# Patient Record
Sex: Female | Born: 1990 | Race: Asian | Hispanic: No | Marital: Single | Smoking: Never smoker
Health system: Southern US, Community
[De-identification: ages and names within clinical notes are randomized; demographics above are authoritative.]

## PROBLEM LIST (undated history)

## (undated) DIAGNOSIS — N760 Acute vaginitis: Secondary | ICD-10-CM

## (undated) DIAGNOSIS — B9689 Other specified bacterial agents as the cause of diseases classified elsewhere: Secondary | ICD-10-CM

## (undated) DIAGNOSIS — F419 Anxiety disorder, unspecified: Secondary | ICD-10-CM

---

## 2015-07-29 ENCOUNTER — Emergency Department (HOSPITAL_COMMUNITY)
Admission: EM | Admit: 2015-07-29 | Discharge: 2015-07-29 | Disposition: A | Discharge status: Home / Self Care | Payer: Self-pay | Attending: Emergency Medicine | Admitting: Emergency Medicine

## 2015-07-29 ENCOUNTER — Encounter (HOSPITAL_COMMUNITY): Payer: Self-pay | Admitting: Emergency Medicine

## 2015-07-29 ENCOUNTER — Emergency Department (HOSPITAL_COMMUNITY)
Admission: EM | Admit: 2015-07-29 | Discharge: 2015-07-30 | Disposition: A | Discharge status: Home / Self Care | Payer: Self-pay | Attending: Emergency Medicine | Admitting: Emergency Medicine

## 2015-07-29 DIAGNOSIS — R51 Headache: Secondary | ICD-10-CM | POA: Insufficient documentation

## 2015-07-29 DIAGNOSIS — R112 Nausea with vomiting, unspecified: Secondary | ICD-10-CM | POA: Insufficient documentation

## 2015-07-29 DIAGNOSIS — Z3202 Encounter for pregnancy test, result negative: Secondary | ICD-10-CM | POA: Insufficient documentation

## 2015-07-29 DIAGNOSIS — R109 Unspecified abdominal pain: Secondary | ICD-10-CM | POA: Insufficient documentation

## 2015-07-29 DIAGNOSIS — R251 Tremor, unspecified: Secondary | ICD-10-CM | POA: Insufficient documentation

## 2015-07-29 DIAGNOSIS — R1110 Vomiting, unspecified: Secondary | ICD-10-CM | POA: Insufficient documentation

## 2015-07-29 LAB — I-STAT BETA HCG BLOOD, ED (MC, WL, AP ONLY)

## 2015-07-29 MED ORDER — SODIUM CHLORIDE 0.9 % IV BOLUS (SEPSIS)
1000.0000 mL | Freq: Once | INTRAVENOUS | Status: AC
  Administered 2015-07-29: 1000 mL via INTRAVENOUS

## 2015-07-29 MED ORDER — KETOROLAC TROMETHAMINE 30 MG/ML IJ SOLN
30.0000 mg | Freq: Once | INTRAMUSCULAR | Status: AC
  Administered 2015-07-29: 30 mg via INTRAVENOUS
  Filled 2015-07-29: qty 1

## 2015-07-29 MED ORDER — SODIUM CHLORIDE 0.9 % IV BOLUS (SEPSIS)
1000.0000 mL | Freq: Once | INTRAVENOUS | Status: AC
  Administered 2015-07-30: 1000 mL via INTRAVENOUS

## 2015-07-29 MED ORDER — ONDANSETRON HCL 4 MG/2ML IJ SOLN
4.0000 mg | Freq: Once | INTRAMUSCULAR | Status: AC
  Administered 2015-07-29: 4 mg via INTRAVENOUS
  Filled 2015-07-29: qty 2

## 2015-07-29 NOTE — ED Provider Notes (Signed)
CSN: [id number]     Arrival date & time 07/29/15  2300 History  By signing my name below, I, Katherine West, attest that this documentation has been prepared under the direction and in the presence of Katherine Lyonsouglas Sherri Mcarthy, MD. Electronically Signed: Bethel BornBritney West, ED Scribe. 07/29/2015. 11:25 PM   Chief Complaint  Patient presents with  . Abdominal Pain   The history is provided by the patient. No language interpreter was used.   Brought in by EMS, Katherine LemmingsMarley Petrucelli is a 25 y.o. female who presents to the Emergency Department complaining of constant nausea and vomiting with onset this morning. Pt states that last night she drank a lot of beer and alcohol and has been vomiting since. Her emesis has the appearance of blood streaked bile. Pt denies abdominal pain. Pt last urinated just PTA. No known sick contact. She is otherwise healthy and has no history of abdominal surgery. She denies risk of pregnancy.    History reviewed. No pertinent past medical history. History reviewed. No pertinent past surgical history. History reviewed. No pertinent family history. Social History  Substance Use Topics  . Smoking status: Never Smoker   . Smokeless tobacco: Never Used  . Alcohol Use: Yes   OB History    No data available     Review of Systems  10 Systems reviewed and all are negative for acute change except as noted in the HPI.  Allergies  Review of patient's allergies indicates no known allergies.  Home Medications   Prior to Admission medications   Medication Sig Start Date End Date Taking? Authorizing Provider  diphenhydrAMINE (BENADRYL) 25 MG tablet Take 25 mg by mouth every 6 (six) hours as needed for sleep.   Yes Historical Provider, MD  ibuprofen (ADVIL,MOTRIN) 800 MG tablet Take 800 mg by mouth every 8 (eight) hours as needed for moderate pain.   Yes Historical Provider, MD   BP 128/77 mmHg  Pulse 99  Temp(Src) 98 F (36.7 C) (Oral)  Resp 20  Ht 5\' 11"  (1.803 m)  Wt 145 lb  (65.772 kg)  BMI 20.23 kg/m2  SpO2 100% Physical Exam  Constitutional: She is oriented to person, place, and time. She appears well-developed and well-nourished. No distress.  HENT:  Head: Normocephalic and atraumatic.  Mouth/Throat: Mucous membranes are dry.  Eyes: EOM are normal.  Neck: Normal range of motion.  Cardiovascular: Normal rate, regular rhythm and normal heart sounds.   Pulmonary/Chest: Effort normal and breath sounds normal.  Abdominal: Soft. She exhibits no distension. There is no tenderness.  Musculoskeletal: Normal range of motion.  Neurological: She is alert and oriented to person, place, and time.  Skin: Skin is warm and dry.  Psychiatric: She has a normal mood and affect. Judgment normal.  Nursing note and vitals reviewed.   ED Course  Procedures (including critical care time) DIAGNOSTIC STUDIES: Oxygen Saturation is 100% on RA,  normal by my interpretation.    COORDINATION OF CARE: 11:24 PM Discussed treatment plan which includes lab work, antiemetic, and IVF with pt at bedside and pt agreed to plan.  Labs Review Labs Reviewed - No data to display  Imaging Review No results found. I have personally reviewed and evaluated these lab results as part of my medical decision-making.    MDM   Final diagnoses:  None    Patient presents with abdominal pain and vomiting. She reports consuming a significant quantity of alcohol the night before and her vomiting started after she woke up. She reports seeing  trace amounts of blood in her vomit. Her physical examination is benign. She was hydrated with normal saline and given Zofran and Phenergan. She is now feeling better and requesting to go home. She will be discharged with Zofran and when necessary return.  I personally performed the services described in this documentation, which was scribed in my presence. The recorded information has been reviewed and is accurate.       Katherine Lyons, MD 07/30/15 [phone]

## 2015-07-29 NOTE — ED Notes (Signed)
Brought in by EMS from home with c/o abdominal pain.  Per EMS, pt reported that she has been having diffused abdominal pain with nausea and vomiting.  Pt is very concerned on "blood in her emesis"; pt reported that she has "streaks of blood" in her vomitus.  Pt was given Zofran 4 mg and Versed 5 mg IV en route to ED.

## 2015-07-29 NOTE — ED Notes (Signed)
Bed: WA09 Expected date:  Expected time:  Means of arrival:  Comments: ems 

## 2015-07-29 NOTE — ED Notes (Signed)
Pt called twice, no answer 

## 2015-07-30 LAB — CBC WITH DIFFERENTIAL/PLATELET
BASOS ABS: 0 10*3/uL (ref 0.0–0.1)
Basophils Relative: 0 %
EOS PCT: 1 %
Eosinophils Absolute: 0.1 10*3/uL (ref 0.0–0.7)
HCT: 32.8 % — ABNORMAL LOW (ref 36.0–46.0)
Hemoglobin: 11.1 g/dL — ABNORMAL LOW (ref 12.0–15.0)
LYMPHS ABS: 1.5 10*3/uL (ref 0.7–4.0)
Lymphocytes Relative: 17 %
MCH: 29.7 pg (ref 26.0–34.0)
MCHC: 33.8 g/dL (ref 30.0–36.0)
MCV: 87.7 fL (ref 78.0–100.0)
MONO ABS: 0.6 10*3/uL (ref 0.1–1.0)
MONOS PCT: 7 %
Neutro Abs: 6.4 10*3/uL (ref 1.7–7.7)
Neutrophils Relative %: 75 %
PLATELETS: UNDETERMINED 10*3/uL (ref 150–400)
RBC: 3.74 MIL/uL — AB (ref 3.87–5.11)
RDW: 13.4 % (ref 11.5–15.5)
WBC: 8.6 10*3/uL (ref 4.0–10.5)

## 2015-07-30 LAB — COMPREHENSIVE METABOLIC PANEL
ALT: 19 U/L (ref 14–54)
ANION GAP: 12 (ref 5–15)
AST: 26 U/L (ref 15–41)
Albumin: 4 g/dL (ref 3.5–5.0)
Alkaline Phosphatase: 44 U/L (ref 38–126)
BUN: 12 mg/dL (ref 6–20)
CALCIUM: 8.1 mg/dL — AB (ref 8.9–10.3)
CHLORIDE: 112 mmol/L — AB (ref 101–111)
CO2: 20 mmol/L — AB (ref 22–32)
Creatinine, Ser: 0.73 mg/dL (ref 0.44–1.00)
Glucose, Bld: 85 mg/dL (ref 65–99)
Potassium: 3.8 mmol/L (ref 3.5–5.1)
SODIUM: 144 mmol/L (ref 135–145)
Total Bilirubin: 0.5 mg/dL (ref 0.3–1.2)
Total Protein: 6.6 g/dL (ref 6.5–8.1)

## 2015-07-30 LAB — LIPASE, BLOOD: LIPASE: 27 U/L (ref 11–51)

## 2015-07-30 MED ORDER — PROMETHAZINE HCL 25 MG/ML IJ SOLN
12.5000 mg | Freq: Once | INTRAMUSCULAR | Status: AC
Start: 2015-07-30 — End: 2015-07-30
  Administered 2015-07-30: 12.5 mg via INTRAVENOUS
  Filled 2015-07-30: qty 1

## 2015-07-30 MED ORDER — ONDANSETRON 8 MG PO TBDP: ORAL_TABLET | ORAL | Status: DC

## 2015-07-30 NOTE — ED Notes (Signed)
No answer when pt's name called in the waiting room 

## 2015-07-30 NOTE — ED Notes (Signed)
No answer when pt's name called in the waiting room; unable to locate pt' pt eloped from waiting room prior to triage

## 2015-07-30 NOTE — Discharge Instructions (Signed)
Zofran as prescribed as needed for nausea. ° °Return to the emergency department if symptoms significantly worsen or change. ° ° °Nausea and Vomiting °Nausea is a sick feeling that often comes before throwing up (vomiting). Vomiting is a reflex where stomach contents come out of your mouth. Vomiting can cause severe loss of body fluids (dehydration). Children and elderly adults can become dehydrated quickly, especially if they also have diarrhea. Nausea and vomiting are symptoms of a condition or disease. It is important to find the cause of your symptoms. °CAUSES  °· Direct irritation of the stomach lining. This irritation can result from increased acid production (gastroesophageal reflux disease), infection, food poisoning, taking certain medicines (such as nonsteroidal anti-inflammatory drugs), alcohol use, or tobacco use. °· Signals from the brain. These signals could be caused by a headache, heat exposure, an inner ear disturbance, increased pressure in the brain from injury, infection, a tumor, or a concussion, pain, emotional stimulus, or metabolic problems. °· An obstruction in the gastrointestinal tract (bowel obstruction). °· Illnesses such as diabetes, hepatitis, gallbladder problems, appendicitis, kidney problems, cancer, sepsis, atypical symptoms of a heart attack, or eating disorders. °· Medical treatments such as chemotherapy and radiation. °· Receiving medicine that makes you sleep (general anesthetic) during surgery. °DIAGNOSIS °Your caregiver may ask for tests to be done if the problems do not improve after a few days. Tests may also be done if symptoms are severe or if the reason for the nausea and vomiting is not clear. Tests may include: °· Urine tests. °· Blood tests. °· Stool tests. °· Cultures (to look for evidence of infection). °· X-rays or other imaging studies. °Test results can help your caregiver make decisions about treatment or the need for additional tests. °TREATMENT °You need to  stay well hydrated. Drink frequently but in small amounts. You may wish to drink water, sports drinks, clear broth, or eat frozen ice pops or gelatin dessert to help stay hydrated. When you eat, eating slowly may help prevent nausea. There are also some antinausea medicines that may help prevent nausea. °HOME CARE INSTRUCTIONS  °· Take all medicine as directed by your caregiver. °· If you do not have an appetite, do not force yourself to eat. However, you must continue to drink fluids. °· If you have an appetite, eat a normal diet unless your caregiver tells you differently. °¨ Eat a variety of complex carbohydrates (rice, wheat, potatoes, bread), lean meats, yogurt, fruits, and vegetables. °¨ Avoid high-fat foods because they are more difficult to digest. °· Drink enough water and fluids to keep your urine clear or pale yellow. °· If you are dehydrated, ask your caregiver for specific rehydration instructions. Signs of dehydration may include: °¨ Severe thirst. °¨ Dry lips and mouth. °¨ Dizziness. °¨ Dark urine. °¨ Decreasing urine frequency and amount. °¨ Confusion. °¨ Rapid breathing or pulse. °SEEK IMMEDIATE MEDICAL CARE IF:  °· You have blood or brown flecks (like coffee grounds) in your vomit. °· You have black or bloody stools. °· You have a severe headache or stiff neck. °· You are confused. °· You have severe abdominal pain. °· You have chest pain or trouble breathing. °· You do not urinate at least once every 8 hours. °· You develop cold or clammy skin. °· You continue to vomit for longer than 24 to 48 hours. °· You have a fever. °MAKE SURE YOU:  °· Understand these instructions. °· Will watch your condition. °· Will get help right away if you are not doing well or get   worse. °  °This information is not intended to replace advice given to you by your health care provider. Make sure you discuss any questions you have with your health care provider. °  °Document Released: 04/29/2005 Document Revised:  07/22/2011 Document Reviewed: 09/26/2010 °Elsevier Interactive Patient Education ©2016 [employer] ° °

## 2015-11-10 ENCOUNTER — Encounter (HOSPITAL_COMMUNITY): Payer: Self-pay

## 2015-11-10 ENCOUNTER — Encounter (HOSPITAL_COMMUNITY): Payer: Self-pay | Admitting: *Deleted

## 2015-11-10 ENCOUNTER — Emergency Department (HOSPITAL_COMMUNITY)
Admission: EM | Admit: 2015-11-10 | Discharge: 2015-11-10 | Disposition: A | Discharge status: Other Acute Inpatient Hospital | Payer: Self-pay | Attending: Emergency Medicine | Admitting: Emergency Medicine

## 2015-11-10 ENCOUNTER — Observation Stay (HOSPITAL_COMMUNITY)
Admission: AD | Admit: 2015-11-10 | Discharge: 2015-11-11 | Disposition: A | Discharge status: Home / Self Care | Payer: Self-pay | Source: Intra-hospital | Attending: Psychiatry | Admitting: Psychiatry

## 2015-11-10 DIAGNOSIS — Z791 Long term (current) use of non-steroidal anti-inflammatories (NSAID): Secondary | ICD-10-CM | POA: Insufficient documentation

## 2015-11-10 DIAGNOSIS — F329 Major depressive disorder, single episode, unspecified: Secondary | ICD-10-CM | POA: Insufficient documentation

## 2015-11-10 DIAGNOSIS — F1320 Sedative, hypnotic or anxiolytic dependence, uncomplicated: Secondary | ICD-10-CM | POA: Diagnosis present

## 2015-11-10 DIAGNOSIS — F10239 Alcohol dependence with withdrawal, unspecified: Secondary | ICD-10-CM | POA: Insufficient documentation

## 2015-11-10 DIAGNOSIS — Z79899 Other long term (current) drug therapy: Secondary | ICD-10-CM | POA: Insufficient documentation

## 2015-11-10 DIAGNOSIS — F10920 Alcohol use, unspecified with intoxication, uncomplicated: Secondary | ICD-10-CM

## 2015-11-10 DIAGNOSIS — F411 Generalized anxiety disorder: Secondary | ICD-10-CM | POA: Insufficient documentation

## 2015-11-10 DIAGNOSIS — F4010 Social phobia, unspecified: Secondary | ICD-10-CM | POA: Insufficient documentation

## 2015-11-10 DIAGNOSIS — F1994 Other psychoactive substance use, unspecified with psychoactive substance-induced mood disorder: Secondary | ICD-10-CM

## 2015-11-10 DIAGNOSIS — F1024 Alcohol dependence with alcohol-induced mood disorder: Principal | ICD-10-CM | POA: Insufficient documentation

## 2015-11-10 DIAGNOSIS — F1020 Alcohol dependence, uncomplicated: Secondary | ICD-10-CM | POA: Diagnosis present

## 2015-11-10 DIAGNOSIS — R000 Tachycardia, unspecified: Secondary | ICD-10-CM | POA: Insufficient documentation

## 2015-11-10 DIAGNOSIS — F10229 Alcohol dependence with intoxication, unspecified: Secondary | ICD-10-CM | POA: Insufficient documentation

## 2015-11-10 DIAGNOSIS — R45851 Suicidal ideations: Secondary | ICD-10-CM

## 2015-11-10 DIAGNOSIS — F1010 Alcohol abuse, uncomplicated: Secondary | ICD-10-CM | POA: Diagnosis present

## 2015-11-10 DIAGNOSIS — F10120 Alcohol abuse with intoxication, uncomplicated: Secondary | ICD-10-CM | POA: Insufficient documentation

## 2015-11-10 LAB — RAPID URINE DRUG SCREEN, HOSP PERFORMED
AMPHETAMINES: NOT DETECTED
BENZODIAZEPINES: POSITIVE — AB
Barbiturates: NOT DETECTED
COCAINE: NOT DETECTED
OPIATES: NOT DETECTED
Tetrahydrocannabinol: NOT DETECTED

## 2015-11-10 LAB — COMPREHENSIVE METABOLIC PANEL
ALT: 26 U/L (ref 14–54)
ANION GAP: 7 (ref 5–15)
AST: 30 U/L (ref 15–41)
Albumin: 5 g/dL (ref 3.5–5.0)
Alkaline Phosphatase: 55 U/L (ref 38–126)
BILIRUBIN TOTAL: 0.3 mg/dL (ref 0.3–1.2)
BUN: 12 mg/dL (ref 6–20)
CHLORIDE: 109 mmol/L (ref 101–111)
CO2: 26 mmol/L (ref 22–32)
Calcium: 9.1 mg/dL (ref 8.9–10.3)
Creatinine, Ser: 0.85 mg/dL (ref 0.44–1.00)
Glucose, Bld: 96 mg/dL (ref 65–99)
POTASSIUM: 3.9 mmol/L (ref 3.5–5.1)
Sodium: 142 mmol/L (ref 135–145)
TOTAL PROTEIN: 8 g/dL (ref 6.5–8.1)

## 2015-11-10 LAB — I-STAT BETA HCG BLOOD, ED (MC, WL, AP ONLY)

## 2015-11-10 LAB — CBC WITH DIFFERENTIAL/PLATELET
BASOS ABS: 0 10*3/uL (ref 0.0–0.1)
Basophils Relative: 0 %
EOS ABS: 0.2 10*3/uL (ref 0.0–0.7)
EOS PCT: 3 %
HCT: 40 % (ref 36.0–46.0)
Hemoglobin: 14 g/dL (ref 12.0–15.0)
LYMPHS ABS: 3.2 10*3/uL (ref 0.7–4.0)
Lymphocytes Relative: 43 %
MCH: 30.8 pg (ref 26.0–34.0)
MCHC: 35 g/dL (ref 30.0–36.0)
MCV: 87.9 fL (ref 78.0–100.0)
MONO ABS: 0.4 10*3/uL (ref 0.1–1.0)
Monocytes Relative: 6 %
Neutro Abs: 3.6 10*3/uL (ref 1.7–7.7)
Neutrophils Relative %: 48 %
PLATELETS: 296 10*3/uL (ref 150–400)
RBC: 4.55 MIL/uL (ref 3.87–5.11)
RDW: 12.9 % (ref 11.5–15.5)
WBC: 7.3 10*3/uL (ref 4.0–10.5)

## 2015-11-10 LAB — I-STAT CHEM 8, ED
BUN: 11 mg/dL (ref 6–20)
CALCIUM ION: 1.12 mmol/L — AB (ref 1.13–1.30)
CREATININE: 1.4 mg/dL — AB (ref 0.44–1.00)
Chloride: 106 mmol/L (ref 101–111)
GLUCOSE: 90 mg/dL (ref 65–99)
HEMATOCRIT: 42 % (ref 36.0–46.0)
HEMOGLOBIN: 14.3 g/dL (ref 12.0–15.0)
Potassium: 3.8 mmol/L (ref 3.5–5.1)
Sodium: 144 mmol/L (ref 135–145)
TCO2: 25 mmol/L (ref 0–100)

## 2015-11-10 LAB — ETHANOL: ALCOHOL ETHYL (B): 335 mg/dL — AB (ref <5)

## 2015-11-10 LAB — SALICYLATE LEVEL: Salicylate Lvl: <4 mg/dL (ref 2.8–30.0)

## 2015-11-10 LAB — ACETAMINOPHEN LEVEL

## 2015-11-10 MED ORDER — HYDROXYZINE HCL 25 MG PO TABS
25.0000 mg | ORAL_TABLET | Freq: Four times a day (QID) | ORAL | Status: DC | PRN
  Administered 2015-11-10: 25 mg via ORAL
  Filled 2015-11-10 (×2): qty 1

## 2015-11-10 MED ORDER — MAGNESIUM HYDROXIDE 400 MG/5ML PO SUSP: 30.0000 mL | Freq: Every day | ORAL | Status: DC | PRN

## 2015-11-10 MED ORDER — CHLORDIAZEPOXIDE HCL 25 MG PO CAPS: 25.0000 mg | ORAL_CAPSULE | Freq: Every day | ORAL | Status: DC

## 2015-11-10 MED ORDER — CHLORDIAZEPOXIDE HCL 25 MG PO CAPS: 25.0000 mg | ORAL_CAPSULE | ORAL | Status: DC

## 2015-11-10 MED ORDER — ACETAMINOPHEN 325 MG PO TABS: 650.0000 mg | ORAL_TABLET | ORAL | Status: DC | PRN

## 2015-11-10 MED ORDER — VITAMIN B-1 100 MG PO TABS: 100.0000 mg | ORAL_TABLET | Freq: Every day | ORAL | Status: DC

## 2015-11-10 MED ORDER — ONDANSETRON 4 MG PO TBDP: 4.0000 mg | ORAL_TABLET | Freq: Four times a day (QID) | ORAL | Status: DC | PRN

## 2015-11-10 MED ORDER — ADULT MULTIVITAMIN W/MINERALS CH
1.0000 | ORAL_TABLET | Freq: Every day | ORAL | Status: DC
  Administered 2015-11-10: 1 via ORAL
  Filled 2015-11-10: qty 1

## 2015-11-10 MED ORDER — IBUPROFEN 600 MG PO TABS: 600.0000 mg | ORAL_TABLET | Freq: Three times a day (TID) | ORAL | Status: DC | PRN

## 2015-11-10 MED ORDER — CHLORDIAZEPOXIDE HCL 25 MG PO CAPS
25.0000 mg | ORAL_CAPSULE | Freq: Three times a day (TID) | ORAL | Status: DC
  Filled 2015-11-10: qty 1

## 2015-11-10 MED ORDER — LOPERAMIDE HCL 2 MG PO CAPS: 2.0000 mg | ORAL_CAPSULE | ORAL | Status: DC | PRN

## 2015-11-10 MED ORDER — GABAPENTIN 100 MG PO CAPS
200.0000 mg | ORAL_CAPSULE | Freq: Two times a day (BID) | ORAL | Status: DC
  Administered 2015-11-10: 200 mg via ORAL
  Filled 2015-11-10: qty 2

## 2015-11-10 MED ORDER — CHLORDIAZEPOXIDE HCL 25 MG PO CAPS: 25.0000 mg | ORAL_CAPSULE | Freq: Four times a day (QID) | ORAL | Status: DC | PRN

## 2015-11-10 MED ORDER — ONDANSETRON HCL 4 MG/2ML IJ SOLN
4.0000 mg | Freq: Once | INTRAMUSCULAR | Status: AC
  Administered 2015-11-10: 4 mg via INTRAVENOUS
  Filled 2015-11-10: qty 2

## 2015-11-10 MED ORDER — IBUPROFEN 200 MG PO TABS
600.0000 mg | ORAL_TABLET | Freq: Three times a day (TID) | ORAL | Status: DC | PRN
  Filled 2015-11-10: qty 3

## 2015-11-10 MED ORDER — HYDROXYZINE HCL 25 MG PO TABS
25.0000 mg | ORAL_TABLET | Freq: Four times a day (QID) | ORAL | Status: DC | PRN
Start: 2015-11-10 — End: 2015-11-10

## 2015-11-10 MED ORDER — ONDANSETRON HCL 4 MG PO TABS: 4.0000 mg | ORAL_TABLET | Freq: Three times a day (TID) | ORAL | Status: DC | PRN

## 2015-11-10 MED ORDER — SODIUM CHLORIDE 0.9 % IV BOLUS (SEPSIS)
1000.0000 mL | Freq: Once | INTRAVENOUS | Status: AC
  Administered 2015-11-10: 1000 mL via INTRAVENOUS

## 2015-11-10 MED ORDER — GABAPENTIN 100 MG PO CAPS
200.0000 mg | ORAL_CAPSULE | Freq: Two times a day (BID) | ORAL | Status: DC
  Administered 2015-11-10 – 2015-11-11 (×2): 200 mg via ORAL
  Filled 2015-11-10: qty 28
  Filled 2015-11-10 (×2): qty 2

## 2015-11-10 MED ORDER — ALUM & MAG HYDROXIDE-SIMETH 200-200-20 MG/5ML PO SUSP: 30.0000 mL | ORAL | Status: DC | PRN

## 2015-11-10 MED ORDER — VITAMIN B-1 100 MG PO TABS
100.0000 mg | ORAL_TABLET | Freq: Every day | ORAL | Status: DC
  Administered 2015-11-11: 100 mg via ORAL
  Filled 2015-11-10: qty 1

## 2015-11-10 MED ORDER — CHLORDIAZEPOXIDE HCL 25 MG PO CAPS
25.0000 mg | ORAL_CAPSULE | Freq: Four times a day (QID) | ORAL | Status: AC
  Administered 2015-11-10 – 2015-11-11 (×3): 25 mg via ORAL
  Filled 2015-11-10 (×3): qty 1

## 2015-11-10 MED ORDER — CHLORDIAZEPOXIDE HCL 25 MG PO CAPS: 25.0000 mg | ORAL_CAPSULE | Freq: Three times a day (TID) | ORAL | Status: DC

## 2015-11-10 MED ORDER — CHLORDIAZEPOXIDE HCL 25 MG PO CAPS
25.0000 mg | ORAL_CAPSULE | Freq: Four times a day (QID) | ORAL | Status: DC
  Filled 2015-11-10: qty 1

## 2015-11-10 MED ORDER — CHLORDIAZEPOXIDE HCL 25 MG PO CAPS
25.0000 mg | ORAL_CAPSULE | Freq: Four times a day (QID) | ORAL | Status: DC | PRN
  Administered 2015-11-11: 25 mg via ORAL

## 2015-11-10 MED ORDER — THIAMINE HCL 100 MG/ML IJ SOLN
100.0000 mg | Freq: Once | INTRAMUSCULAR | Status: AC
  Administered 2015-11-10: 100 mg via INTRAMUSCULAR
  Filled 2015-11-10: qty 2

## 2015-11-10 MED ORDER — ADULT MULTIVITAMIN W/MINERALS CH
1.0000 | ORAL_TABLET | Freq: Every day | ORAL | Status: DC
  Administered 2015-11-11: 1 via ORAL
  Filled 2015-11-10: qty 1

## 2015-11-10 MED ORDER — ONDANSETRON 4 MG PO TBDP
4.0000 mg | ORAL_TABLET | Freq: Four times a day (QID) | ORAL | Status: DC | PRN
  Administered 2015-11-10: 4 mg via ORAL
  Filled 2015-11-10: qty 1

## 2015-11-10 NOTE — ED Notes (Signed)
ETOH 335 per lab, notified RN.  MD notified.

## 2015-11-10 NOTE — ED Provider Notes (Signed)
CSN: [id number]     Arrival date & time 11/10/15  0245 History  By signing my name below, I, Rosario AdieWilliam Andrew Hiatt, attest that this documentation has been prepared under the direction and in the presence of Armina Galloway, MD.  Electronically Signed: Rosario AdieWilliam Andrew Hiatt, ED Scribe. 11/10/2015. 3:13 AM.   Chief Complaint  Patient presents with  . Suicidal  . Alcohol Intoxication   Patient is a 25 y.o. female presenting with mental health disorder. The history is provided by the patient and the EMS personnel. No language interpreter was used.  Mental Health Problem Presenting symptoms: suicidal thoughts   Presenting symptoms: no disorganized speech   Onset quality:  Unable to specify Timing:  Constant Progression:  Unchanged Chronicity:  New Context: alcohol use and medication   Treatment compliance:  Untreated Relieved by:  Nothing Worsened by:  Nothing tried Ineffective treatments:  None tried Associated symptoms: no abdominal pain     HPI Comments: Katherine West is a 25 y.o. female BIB EMS with a PMHx significant for anxiety who presents to the Emergency Department with alcohol intoxication and SI onset PTA. Pt reports that she drank an entire bottle of Absolut vodka and "some wine" before arrival, but is unable to state the size of the bottles or over how many hours she drank both of them. She also notes that she took 4 2mg  Xanax pills because of her anxiety. She also reports that she was having SI, but did not state any plan. She is not complaining of any other symptoms at this time.  No past medical history on file. History reviewed. No pertinent past surgical history. No family history on file. Social History  Substance Use Topics  . Smoking status: Never Smoker   . Smokeless tobacco: Never Used  . Alcohol Use: Yes   OB History    No data available     Review of Systems  Gastrointestinal: Negative for abdominal pain.  Psychiatric/Behavioral: Positive for suicidal  ideas.  All other systems reviewed and are negative.  Allergies  Review of patient's allergies indicates no known allergies.  Home Medications   Prior to Admission medications   Medication Sig Start Date End Date Taking? Authorizing Provider  diphenhydrAMINE (BENADRYL) 25 MG tablet Take 25 mg by mouth every 6 (six) hours as needed for sleep.    Historical Provider, MD  ibuprofen (ADVIL,MOTRIN) 800 MG tablet Take 800 mg by mouth every 8 (eight) hours as needed for moderate pain.    Historical Provider, MD  ondansetron (ZOFRAN ODT) 8 MG disintegrating tablet 8mg  ODT q4 hours prn nausea 07/30/15   Geoffery Lyonsouglas Delo, MD   BP 133/86 mmHg  Pulse 98  Temp(Src) 97.9 F (36.6 C) (Oral)  Resp 20  SpO2 98%   Physical Exam  Constitutional: She appears well-developed and well-nourished. No distress.  HENT:  Head: Normocephalic and atraumatic.  Mouth/Throat: Oropharynx is clear and moist. No oropharyngeal exudate.  She has a Mallampati class 1 airway.   Eyes: Conjunctivae and EOM are normal. Pupils are equal, round, and reactive to light.  Neck: Trachea normal and normal range of motion. Neck supple. No JVD present. Carotid bruit is not present.  Cardiovascular: Regular rhythm and normal heart sounds.  Tachycardia present.  Exam reveals no gallop and no friction rub.   No murmur heard. Pulses:      Radial pulses are 2+ on the right side, and 2+ on the left side.  Pulmonary/Chest: Effort normal and breath sounds normal. No stridor.  No respiratory distress. She has no wheezes. She has no rales. She exhibits no tenderness.  Abdominal: Soft. Bowel sounds are normal. There is no tenderness. There is no rebound and no guarding.  Abdomen is protuberant.   Musculoskeletal: Normal range of motion.  Lymphadenopathy:    She has no cervical adenopathy.  Neurological: She is alert. She has normal reflexes. No cranial nerve deficit. She exhibits normal muscle tone. Coordination normal.  She has intact  phonation.  Skin: Skin is warm and dry. She is not diaphoretic.  Psychiatric: Her mood appears anxious. Her speech is tangential. She expresses suicidal ideation.  Pt is tearful on exam.  Nursing note and vitals reviewed.  ED Course  Procedures (including critical care time) DIAGNOSTIC STUDIES: Oxygen Saturation is 99% on RA, normal by my interpretation.   COORDINATION OF CARE: 2:47 AM-Discussed next steps with pt. Pt verbalized understanding and is agreeable with the plan.   Labs Review Labs Reviewed  CBC WITH DIFFERENTIAL/PLATELET  COMPREHENSIVE METABOLIC PANEL  ETHANOL  URINE RAPID DRUG SCREEN, HOSP PERFORMED  ACETAMINOPHEN LEVEL  SALICYLATE LEVEL  I-STAT BETA HCG BLOOD, ED ([facility], [facility], [facility] ONLY)  I-STAT CHEM 8, ED    Imaging Review No results found.  I have personally reviewed and evaluated these images and lab results as part of my medical decision-making.   EKG Interpretation None      MDM   Final diagnoses:  None     EKG Interpretation  Date/Time:  Friday November 10 2015 02:47:59 EDT Ventricular Rate:  101 PR Interval:    QRS Duration: 88 QT Interval:  324 QTC Calculation: 420 R Axis:   82 Text Interpretation:  Sinus tachycardia Confirmed by [facility]  MD, Pradeep Beaubrun ([id number]) on 11/10/2015 5:12:52 AM      Filed Vitals:   11/10/15 0358 11/10/15 0551  BP: 118/75 123/88  Pulse: 94 87  Temp:    Resp: 19 20   Results for orders placed or performed during the hospital encounter of 11/10/15  CBC with Differential/Platelet  Result Value Ref Range   WBC 7.3 4.0 - 10.5 K/uL   RBC 4.55 3.87 - 5.11 MIL/uL   Hemoglobin 14.0 12.0 - 15.0 g/dL   HCT 60.4 54.0 - 98.1 %   MCV 87.9 78.0 - 100.0 fL   MCH 30.8 26.0 - 34.0 pg   MCHC 35.0 30.0 - 36.0 g/dL   RDW 19.1 47.8 - 29.5 %   Platelets 296 150 - 400 K/uL   Neutrophils Relative % 48 %   Neutro Abs 3.6 1.7 - 7.7 K/uL   Lymphocytes Relative 43 %   Lymphs Abs 3.2 0.7 - 4.0 K/uL   Monocytes Relative 6 %    Monocytes Absolute 0.4 0.1 - 1.0 K/uL   Eosinophils Relative 3 %   Eosinophils Absolute 0.2 0.0 - 0.7 K/uL   Basophils Relative 0 %   Basophils Absolute 0.0 0.0 - 0.1 K/uL  Comprehensive metabolic panel  Result Value Ref Range   Sodium 142 135 - 145 mmol/L   Potassium 3.9 3.5 - 5.1 mmol/L   Chloride 109 101 - 111 mmol/L   CO2 26 22 - 32 mmol/L   Glucose, Bld 96 65 - 99 mg/dL   BUN 12 6 - 20 mg/dL   Creatinine, Ser 6.21 0.44 - 1.00 mg/dL   Calcium 9.1 8.9 - 30.8 mg/dL   Total Protein 8.0 6.5 - 8.1 g/dL   Albumin 5.0 3.5 - 5.0 g/dL   AST 30 15 - 41  U/L   ALT 26 14 - 54 U/L   Alkaline Phosphatase 55 38 - 126 U/L   Total Bilirubin 0.3 0.3 - 1.2 mg/dL   GFR calc non Af Amer >60 >60 mL/min   GFR calc Af Amer >60 >60 mL/min   Anion gap 7 5 - 15  Ethanol  Result Value Ref Range   Alcohol, Ethyl (B) 335 (HH) <5 mg/dL  Urine rapid drug screen (hosp performed)  Result Value Ref Range   Opiates NONE DETECTED NONE DETECTED   Cocaine NONE DETECTED NONE DETECTED   Benzodiazepines POSITIVE (A) NONE DETECTED   Amphetamines NONE DETECTED NONE DETECTED   Tetrahydrocannabinol NONE DETECTED NONE DETECTED   Barbiturates NONE DETECTED NONE DETECTED  Acetaminophen level  Result Value Ref Range   Acetaminophen (Tylenol), Serum <10 (L) 10 - 30 ug/mL  Salicylate level  Result Value Ref Range   Salicylate Lvl <4.0 2.8 - 30.0 mg/dL  I-Stat Beta hCG blood, ED ([facility], [facility], [facility] only)  Result Value Ref Range   I-stat hCG, quantitative <5.0 <5 mIU/mL   Comment 3          I-Stat Chem 8, ED  Result Value Ref Range   Sodium 144 135 - 145 mmol/L   Potassium 3.8 3.5 - 5.1 mmol/L   Chloride 106 101 - 111 mmol/L   BUN 11 6 - 20 mg/dL   Creatinine, Ser 2.951.40 (H) 0.44 - 1.00 mg/dL   Glucose, Bld 90 65 - 99 mg/dL   Calcium, Ion 2.841.12 (L) 1.13 - 1.30 mmol/L   TCO2 25 0 - 100 mmol/L   Hemoglobin 14.3 12.0 - 15.0 g/dL   HCT 13.242.0 44.036.0 - 10.246.0 %   No results found.  Medications  sodium chloride 0.9 % bolus 1,000  mL (0 mLs Intravenous Stopped 11/10/15 0623)  ondansetron (ZOFRAN) injection 4 mg (4 mg Intravenous Given 11/10/15 0622)    Will need to be cleared by psychiatry as she repeated that she was suicidal in front of multiple staff members on more than one occasion  Psych hold orders placed I personally performed the services described in this documentation, which was scribed in my presence. The recorded information has been reviewed and is accurate.       Cy BlamerApril Shanie Mauzy, MD 11/10/15 [phone]
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
## 2015-11-10 NOTE — ED Notes (Signed)
Pt was giving a Malaw[place]key sandwich and water

## 2015-11-10 NOTE — Progress Notes (Signed)
Pt [place] ref[place] in and around [place]
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
## 2015-11-10 NOTE — ED Notes (Signed)
Patient has slept since arriving on the unit.  She was just [facility].  [facility]  Patient left the unit ambulatory with [employer].  All belongings given to the driver.
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
## 2015-11-10 NOTE — ED Notes (Addendum)
Per EMS- Pt found laying on floor, no fall. Had ETOH tonight. Denies drugs. Pt stated that she cannot breathe and that she felt as if she was going to have a seizure. Pt endorsing SI upon asking in room. Stated to have taken 8mg  of Xanax, not to hurt herself."

## 2015-11-11 LAB — PREGNANCY, URINE: PREG TEST UR: NEGATIVE

## 2015-11-11 MED ORDER — GABAPENTIN 100 MG PO CAPS: 200.0000 mg | ORAL_CAPSULE | Freq: Two times a day (BID) | ORAL | Status: AC

## 2015-11-11 MED ORDER — HYDROXYZINE HCL 25 MG PO TABS: 25.0000 mg | ORAL_TABLET | Freq: Four times a day (QID) | ORAL | Status: AC | PRN

## 2015-11-11 MED ORDER — ADULT MULTIVITAMIN W/MINERALS CH: 1.0000 | ORAL_TABLET | Freq: Every day | ORAL | Status: AC

## 2015-11-11 NOTE — Progress Notes (Signed)
D: Patient slept most of the shift. Staff woke patient up for bed time meds and assessment. Denies pain, SI, AH/VHat this time/ reports depression of 5/10. Minimal conversation. Made no new complaint. Patient sleeping at this time.  A: Staff offered support and encouragement as needed. Offered due meds as ordered. Constant monitoring for safety and stability. R: Patient is safe.
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
## 2015-11-11 NOTE — Progress Notes (Signed)
D: Pt d/c home as per MD's order. Pt was given outpatient resources in [place] for follow up care. Pt was picked up by her friend. A: Scheduled medications administered as prescribed. D/C instructions reviewed with pt. All belongings from locker 16 returned to pt at time of d/c. Emotional support and availability provided provided to pt. Safety maintained in Obs. Unit under constant visual supervision without self injurious behavior or outburst.  R: Pt A & O X 4. Pt compliant with medications when offered. Denies adverse drug reactions when assessed. Pt signed belonging sheet in agreement with items received from locker. No physical distress noted at time of d/c.

## 2015-11-16 ENCOUNTER — Encounter (HOSPITAL_COMMUNITY): Payer: Self-pay

## 2015-11-16 ENCOUNTER — Observation Stay (HOSPITAL_COMMUNITY)
Admission: EM | Admit: 2015-11-16 | Discharge: 2015-11-17 | Disposition: A | Discharge status: Other Acute Inpatient Hospital | Payer: Self-pay | Attending: Emergency Medicine | Admitting: Emergency Medicine

## 2015-11-16 ENCOUNTER — Emergency Department (HOSPITAL_COMMUNITY): Payer: Self-pay

## 2015-11-16 DIAGNOSIS — R45851 Suicidal ideations: Secondary | ICD-10-CM | POA: Insufficient documentation

## 2015-11-16 DIAGNOSIS — F1020 Alcohol dependence, uncomplicated: Secondary | ICD-10-CM

## 2015-11-16 DIAGNOSIS — S0083XA Contusion of other part of head, initial encounter: Secondary | ICD-10-CM | POA: Insufficient documentation

## 2015-11-16 DIAGNOSIS — W19XXXA Unspecified fall, initial encounter: Secondary | ICD-10-CM

## 2015-11-16 DIAGNOSIS — F1094 Alcohol use, unspecified with alcohol-induced mood disorder: Secondary | ICD-10-CM

## 2015-11-16 DIAGNOSIS — W228XXA Striking against or struck by other objects, initial encounter: Secondary | ICD-10-CM | POA: Insufficient documentation

## 2015-11-16 DIAGNOSIS — S301XXA Contusion of abdominal wall, initial encounter: Secondary | ICD-10-CM | POA: Insufficient documentation

## 2015-11-16 DIAGNOSIS — F10129 Alcohol abuse with intoxication, unspecified: Principal | ICD-10-CM | POA: Insufficient documentation

## 2015-11-16 DIAGNOSIS — I2699 Other pulmonary embolism without acute cor pulmonale: Secondary | ICD-10-CM | POA: Diagnosis present

## 2015-11-16 DIAGNOSIS — IMO0002 Reserved for concepts with insufficient information to code with codable children: Secondary | ICD-10-CM

## 2015-11-16 DIAGNOSIS — T148XXA Other injury of unspecified body region, initial encounter: Secondary | ICD-10-CM

## 2015-11-16 LAB — COMPREHENSIVE METABOLIC PANEL
ALT: 22 U/L (ref 14–54)
AST: 31 U/L (ref 15–41)
Albumin: 4.6 g/dL (ref 3.5–5.0)
Alkaline Phosphatase: 49 U/L (ref 38–126)
Anion gap: 8 (ref 5–15)
BILIRUBIN TOTAL: 0.2 mg/dL — AB (ref 0.3–1.2)
BUN: 7 mg/dL (ref 6–20)
CHLORIDE: 116 mmol/L — AB (ref 101–111)
CO2: 24 mmol/L (ref 22–32)
CREATININE: 0.66 mg/dL (ref 0.44–1.00)
Calcium: 8.4 mg/dL — ABNORMAL LOW (ref 8.9–10.3)
Glucose, Bld: 93 mg/dL (ref 65–99)
POTASSIUM: 3.7 mmol/L (ref 3.5–5.1)
Sodium: 148 mmol/L — ABNORMAL HIGH (ref 135–145)
TOTAL PROTEIN: 7.5 g/dL (ref 6.5–8.1)

## 2015-11-16 LAB — CBC WITH DIFFERENTIAL/PLATELET
BASOS ABS: 0 10*3/uL (ref 0.0–0.1)
BASOS PCT: 0 %
EOS PCT: 1 %
Eosinophils Absolute: 0 10*3/uL (ref 0.0–0.7)
HCT: 37.5 % (ref 36.0–46.0)
Hemoglobin: 13.3 g/dL (ref 12.0–15.0)
Lymphocytes Relative: 60 %
Lymphs Abs: 2.5 10*3/uL (ref 0.7–4.0)
MCH: 30.6 pg (ref 26.0–34.0)
MCHC: 35.5 g/dL (ref 30.0–36.0)
MCV: 86.2 fL (ref 78.0–100.0)
MONO ABS: 0.2 10*3/uL (ref 0.1–1.0)
Monocytes Relative: 5 %
Neutro Abs: 1.4 10*3/uL — ABNORMAL LOW (ref 1.7–7.7)
Neutrophils Relative %: 34 %
PLATELETS: 253 10*3/uL (ref 150–400)
RBC: 4.35 MIL/uL (ref 3.87–5.11)
RDW: 13.2 % (ref 11.5–15.5)
WBC: 4.1 10*3/uL (ref 4.0–10.5)

## 2015-11-16 LAB — SALICYLATE LEVEL: Salicylate Lvl: <4 mg/dL (ref 2.8–30.0)

## 2015-11-16 LAB — ACETAMINOPHEN LEVEL: Acetaminophen (Tylenol), Serum: <10 ug/mL — ABNORMAL LOW (ref 10–30)

## 2015-11-16 LAB — ETHANOL: ALCOHOL ETHYL (B): 391 mg/dL — AB (ref <5)

## 2015-11-16 LAB — RAPID URINE DRUG SCREEN, HOSP PERFORMED
AMPHETAMINES: NOT DETECTED
BENZODIAZEPINES: POSITIVE — AB
Barbiturates: NOT DETECTED
COCAINE: NOT DETECTED
OPIATES: NOT DETECTED
Tetrahydrocannabinol: NOT DETECTED

## 2015-11-16 LAB — I-STAT BETA HCG BLOOD, ED (MC, WL, AP ONLY): I-stat hCG, quantitative: <5 m[IU]/mL (ref <5)

## 2015-11-16 MED ORDER — SODIUM CHLORIDE 0.9 % IV BOLUS (SEPSIS)
1000.0000 mL | Freq: Once | INTRAVENOUS | Status: AC
  Administered 2015-11-17: 1000 mL via INTRAVENOUS
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
## 2015-11-16 NOTE — ED Notes (Addendum)
Patient thrashing and kicking during blood draw.  Unable to obtain IV access.  PA notified.

## 2015-11-16 NOTE — ED Notes (Signed)
Bed: RESB Expected date:  Expected time:  Means of arrival:  Comments: Etoh/fall/laceration

## 2015-11-16 NOTE — ED Notes (Signed)
391 ETOH. Primary nurse made aware of same.

## 2015-11-17 ENCOUNTER — Observation Stay (HOSPITAL_COMMUNITY)
Admission: AD | Admit: 2015-11-17 | Discharge: 2015-11-18 | Disposition: A | Discharge status: Home / Self Care | Payer: Self-pay | Source: Intra-hospital | Attending: Psychiatry | Admitting: Psychiatry

## 2015-11-17 ENCOUNTER — Encounter (HOSPITAL_COMMUNITY): Payer: Self-pay | Admitting: *Deleted

## 2015-11-17 DIAGNOSIS — Y908 Blood alcohol level of 240 mg/100 ml or more: Secondary | ICD-10-CM | POA: Insufficient documentation

## 2015-11-17 DIAGNOSIS — Z86711 Personal history of pulmonary embolism: Secondary | ICD-10-CM | POA: Insufficient documentation

## 2015-11-17 DIAGNOSIS — F411 Generalized anxiety disorder: Secondary | ICD-10-CM | POA: Insufficient documentation

## 2015-11-17 DIAGNOSIS — F1990 Other psychoactive substance use, unspecified, uncomplicated: Secondary | ICD-10-CM | POA: Insufficient documentation

## 2015-11-17 DIAGNOSIS — R45851 Suicidal ideations: Secondary | ICD-10-CM | POA: Insufficient documentation

## 2015-11-17 DIAGNOSIS — F10229 Alcohol dependence with intoxication, unspecified: Secondary | ICD-10-CM | POA: Insufficient documentation

## 2015-11-17 DIAGNOSIS — I2699 Other pulmonary embolism without acute cor pulmonale: Secondary | ICD-10-CM | POA: Diagnosis present

## 2015-11-17 DIAGNOSIS — F1094 Alcohol use, unspecified with alcohol-induced mood disorder: Secondary | ICD-10-CM | POA: Diagnosis present

## 2015-11-17 DIAGNOSIS — F1024 Alcohol dependence with alcohol-induced mood disorder: Principal | ICD-10-CM | POA: Insufficient documentation

## 2015-11-17 DIAGNOSIS — F4010 Social phobia, unspecified: Secondary | ICD-10-CM | POA: Insufficient documentation

## 2015-11-17 LAB — ETHANOL
Alcohol, Ethyl (B): 281 mg/dL — ABNORMAL HIGH (ref <5)
Alcohol, Ethyl (B): 212 mg/dL — ABNORMAL HIGH (ref <5)

## 2015-11-17 MED ORDER — THIAMINE HCL 100 MG/ML IJ SOLN: 100.0000 mg | Freq: Once | INTRAMUSCULAR | Status: DC

## 2015-11-17 MED ORDER — ONDANSETRON 4 MG PO TBDP
4.0000 mg | ORAL_TABLET | Freq: Four times a day (QID) | ORAL | Status: DC | PRN
  Administered 2015-11-17 – 2015-11-18 (×2): 4 mg via ORAL
  Filled 2015-11-17 (×2): qty 1

## 2015-11-17 MED ORDER — VITAMIN B-1 100 MG PO TABS: 100.0000 mg | ORAL_TABLET | Freq: Every day | ORAL | Status: DC

## 2015-11-17 MED ORDER — LORAZEPAM 1 MG PO TABS: 1.0000 mg | ORAL_TABLET | Freq: Four times a day (QID) | ORAL | Status: DC

## 2015-11-17 MED ORDER — LIDOCAINE HCL 1 % IJ SOLN
0.9000 mL | Freq: Once | INTRAMUSCULAR | Status: AC
  Administered 2015-11-17: 0.9 mL
  Filled 2015-11-17 (×2): qty 0.9

## 2015-11-17 MED ORDER — LORAZEPAM 1 MG PO TABS: 1.0000 mg | ORAL_TABLET | Freq: Every day | ORAL | Status: DC

## 2015-11-17 MED ORDER — ONDANSETRON HCL 4 MG/2ML IJ SOLN
4.0000 mg | Freq: Once | INTRAMUSCULAR | Status: AC
  Administered 2015-11-17: 4 mg via INTRAVENOUS
  Filled 2015-11-17: qty 2

## 2015-11-17 MED ORDER — LORAZEPAM 1 MG PO TABS
1.0000 mg | ORAL_TABLET | Freq: Four times a day (QID) | ORAL | Status: DC | PRN
  Filled 2015-11-17: qty 1

## 2015-11-17 MED ORDER — LORAZEPAM 1 MG PO TABS: 1.0000 mg | ORAL_TABLET | Freq: Two times a day (BID) | ORAL | Status: DC

## 2015-11-17 MED ORDER — ONDANSETRON 4 MG PO TBDP: 4.0000 mg | ORAL_TABLET | Freq: Four times a day (QID) | ORAL | Status: DC | PRN

## 2015-11-17 MED ORDER — LOPERAMIDE HCL 2 MG PO CAPS: 2.0000 mg | ORAL_CAPSULE | ORAL | Status: DC | PRN

## 2015-11-17 MED ORDER — MAGNESIUM HYDROXIDE 400 MG/5ML PO SUSP: 30.0000 mL | Freq: Every day | ORAL | Status: DC | PRN

## 2015-11-17 MED ORDER — VITAMIN B-1 100 MG PO TABS
100.0000 mg | ORAL_TABLET | Freq: Every day | ORAL | Status: DC
  Administered 2015-11-18: 100 mg via ORAL
  Filled 2015-11-17: qty 1

## 2015-11-17 MED ORDER — LORAZEPAM 1 MG PO TABS: 1.0000 mg | ORAL_TABLET | Freq: Three times a day (TID) | ORAL | Status: DC

## 2015-11-17 MED ORDER — HYDROXYZINE HCL 25 MG PO TABS: 25.0000 mg | ORAL_TABLET | Freq: Four times a day (QID) | ORAL | Status: DC | PRN

## 2015-11-17 MED ORDER — ACETAMINOPHEN 325 MG PO TABS: 650.0000 mg | ORAL_TABLET | Freq: Four times a day (QID) | ORAL | Status: DC | PRN

## 2015-11-17 MED ORDER — LORAZEPAM 1 MG PO TABS: 1.0000 mg | ORAL_TABLET | Freq: Four times a day (QID) | ORAL | Status: DC | PRN

## 2015-11-17 MED ORDER — IBUPROFEN 800 MG PO TABS: 800.0000 mg | ORAL_TABLET | Freq: Three times a day (TID) | ORAL | Status: DC | PRN

## 2015-11-17 MED ORDER — ALUM & MAG HYDROXIDE-SIMETH 200-200-20 MG/5ML PO SUSP: 30.0000 mL | ORAL | Status: DC | PRN

## 2015-11-17 MED ORDER — IBUPROFEN 800 MG PO TABS
800.0000 mg | ORAL_TABLET | Freq: Three times a day (TID) | ORAL | Status: DC | PRN
  Administered 2015-11-17: 800 mg via ORAL
  Filled 2015-11-17: qty 1

## 2015-11-17 MED ORDER — VITAMIN B-1 100 MG PO TABS
100.0000 mg | ORAL_TABLET | Freq: Once | ORAL | Status: AC
  Administered 2015-11-17: 100 mg via ORAL
  Filled 2015-11-17: qty 1

## 2015-11-17 MED ORDER — ONDANSETRON 8 MG PO TBDP
8.0000 mg | ORAL_TABLET | Freq: Once | ORAL | Status: AC
  Administered 2015-11-17: 8 mg via ORAL
  Filled 2015-11-17: qty 1

## 2015-11-17 MED ORDER — LORAZEPAM 1 MG PO TABS
1.0000 mg | ORAL_TABLET | Freq: Four times a day (QID) | ORAL | Status: AC
  Administered 2015-11-17 – 2015-11-18 (×3): 1 mg via ORAL
  Filled 2015-11-17 (×3): qty 1

## 2015-11-17 MED ORDER — ADULT MULTIVITAMIN W/MINERALS CH
1.0000 | ORAL_TABLET | Freq: Every day | ORAL | Status: DC
  Filled 2015-11-17: qty 1

## 2015-11-17 MED ORDER — ADULT MULTIVITAMIN W/MINERALS CH: 1.0000 | ORAL_TABLET | Freq: Every day | ORAL | Status: DC

## 2015-11-17 MED ORDER — ADULT MULTIVITAMIN W/MINERALS CH
1.0000 | ORAL_TABLET | Freq: Every day | ORAL | Status: DC
  Administered 2015-11-18: 1 via ORAL
  Filled 2015-11-17: qty 1

## 2015-11-17 MED ORDER — AZITHROMYCIN 250 MG PO TABS
1000.0000 mg | ORAL_TABLET | Freq: Once | ORAL | Status: AC
  Administered 2015-11-17: 1000 mg via ORAL
  Filled 2015-11-17: qty 4

## 2015-11-17 MED ORDER — TETANUS-DIPHTH-ACELL PERTUSSIS 5-2.5-18.5 LF-MCG/0.5 IM SUSP
0.5000 mL | Freq: Once | INTRAMUSCULAR | Status: AC
  Administered 2015-11-17: 0.5 mL via INTRAMUSCULAR
  Filled 2015-11-17: qty 0.5

## 2015-11-17 MED ORDER — CEFTRIAXONE SODIUM 250 MG IJ SOLR
250.0000 mg | Freq: Once | INTRAMUSCULAR | Status: AC
  Administered 2015-11-17: 250 mg via INTRAMUSCULAR
  Filled 2015-11-17 (×2): qty 250

## 2015-11-17 MED ORDER — ADULT MULTIVITAMIN W/MINERALS CH
1.0000 | ORAL_TABLET | Freq: Every day | ORAL | Status: DC
  Administered 2015-11-17: 1 via ORAL

## 2015-11-17 MED ORDER — LORAZEPAM 1 MG PO TABS
1.0000 mg | ORAL_TABLET | Freq: Every day | ORAL | Status: DC
  Administered 2015-11-17: 1 mg via ORAL

## 2015-11-17 NOTE — BH Assessment (Signed)
[facility] Assessment Progress Note  Per [name], MD, this pt would benefit from [name] at this time.  Pt presents under IVC, which Dr Jannifer FranklinAkintayo has rescinded.  Katherine Heinrichina Tate, RN, [facility] pt to Obs 3.  Pt has signed Voluntary Admission and Consent for Treatment, as well as Consent to Release Information, and signed forms have been faxed to [facility].  Pt's nurse has been notified, and agrees to send original paperwork along with pt via [facility], and to call report to [phone] or [phone].  Katherine Canninghomas Philbert Ocallaghan, MA Triage Specialist [phone]

## 2015-11-17 NOTE — ED Notes (Signed)
Pt awake, attempting to eat breakfast. Sitter at bedside.

## 2015-11-17 NOTE — ED Notes (Signed)
[facility]

## 2015-11-17 NOTE — ED Notes (Signed)
Patient sleeping breathing even and unlabored.  Sitter at bedside.

## 2015-11-17 NOTE — Progress Notes (Signed)
Initial Interdisciplinary Treatment Plan   PATIENT STRESSORS: Substance abuse   PATIENT STRENGTHS: Active sense of humor Average or above average intelligence Capable of independent living   PROBLEM LIST: Problem List/Patient Goals Date to be addressed Date deferred Reason deferred Estimated date of resolution  I black out frequently after drinking so much.  11/17/2015     I need to slow down 11/17/2015     I was just here last week.  11/17/2015                                          DISCHARGE CRITERIA:  Ability to meet basic life and health needs Adequate post-discharge living arrangements Improved stabilization in mood, thinking, and/or behavior Medical problems require only outpatient monitoring Motivation to continue treatment in a less acute level of care Need for constant or close observation no longer present Reduction of life-threatening or endangering symptoms to within safe limits Safe-care adequate arrangements made Verbal commitment to aftercare and medication compliance Withdrawal symptoms are absent or subacute and managed without 24-hour nursing intervention  PRELIMINARY DISCHARGE PLAN: Attend aftercare/continuing care group  PATIENT/FAMIILY INVOLVEMENT: This treatment plan has been presented to and reviewed with the patient, Shaliah Maina.  The patient has been given the opportunity to ask questions and make suggestions.  Laren BoomHarmon, Keiton Cosma P 11/17/2015, 6:03 PM

## 2015-11-17 NOTE — ED Notes (Signed)
Pt resting at present with eyes closed, sitter present at bedside.Continue to monitor.
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
## 2015-11-17 NOTE — ED Notes (Signed)
Patient on with TTS  

## 2015-11-17 NOTE — ED Provider Notes (Signed)
Patient here for suicidal ideations and alcohol intoxication. Care assumed from HaileyKayla Rose, [place]. Please see her note for further detail. Plan is for allowing patient to sober up, reassess suicidal ideation. Upon reassessment at 4:20 AM, patient is able to carry on clear, goal oriented conversation and is clinically competent- states "if I had a rope, it would be over". To clarify, patient reports she would hang herself. Plan to consult TTS.  Filed Vitals:   11/16/15 2106 11/17/15 0343  BP: 135/89 118/67  Pulse: 101 89  Temp: 97.4 F (36.3 C)   Resp: 18 19   Discussed with Behavioral Health, Venda RodesFord Warrick, patient does meet inpatient criteria. They will bring her to unit when alcohol level reaches 200-250. Also recommends IVC as patient is suicidal and threatening to leave emergency department.  Joycie PeekBenjamin Nobel Brar, PA-C 11/17/15 0534  Gilda Creasehristopher J Pollina, MD 11/17/15 [phone]
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
## 2015-11-17 NOTE — Progress Notes (Addendum)
[facility]. Patient mood is labile and depressed. She said with a smile that she got" turned up and was also going to get for her birthday."  She stated that she is originally from [place] and moved to [place] with her mother.  She lives in [place], [place]. She has been here partying in [place], [place]. She lives with mother and younger siblings. She is pleasant. She stated that she was here last week and that she was going to get turned up for her birthday. She was laughing about her BAL being at 393 mg.dl. Educated patient on BAL. Patient 's plan is to stabilize after BAL and she is CIWA protocol. Patient stated that she had been having copious, yellow discharge and that there was possibility that she may have had a tampon stuck in her vagina. Notified Conrad PA. Skin was assessed and patient has bruising through from fall when drinking.   Patient remains safe through constant observation, education, support, and encouragement. She consented to documentation of safety contract.   Patient is receptive and cooperative, will continue to monitor.

## 2015-11-18 ENCOUNTER — Emergency Department (HOSPITAL_COMMUNITY)
Admission: EM | Admit: 2015-11-18 | Discharge: 2015-11-18 | Disposition: A | Discharge status: Home / Self Care | Payer: MEDICAID | Attending: Emergency Medicine | Admitting: Emergency Medicine

## 2015-11-18 ENCOUNTER — Encounter (HOSPITAL_COMMUNITY): Payer: Self-pay

## 2015-11-18 DIAGNOSIS — N760 Acute vaginitis: Secondary | ICD-10-CM | POA: Insufficient documentation

## 2015-11-18 DIAGNOSIS — Y929 Unspecified place or not applicable: Secondary | ICD-10-CM | POA: Insufficient documentation

## 2015-11-18 DIAGNOSIS — F1094 Alcohol use, unspecified with alcohol-induced mood disorder: Secondary | ICD-10-CM

## 2015-11-18 DIAGNOSIS — Y939 Activity, unspecified: Secondary | ICD-10-CM | POA: Insufficient documentation

## 2015-11-18 DIAGNOSIS — X58XXXA Exposure to other specified factors, initial encounter: Secondary | ICD-10-CM | POA: Insufficient documentation

## 2015-11-18 DIAGNOSIS — Y999 Unspecified external cause status: Secondary | ICD-10-CM | POA: Insufficient documentation

## 2015-11-18 DIAGNOSIS — B9689 Other specified bacterial agents as the cause of diseases classified elsewhere: Secondary | ICD-10-CM

## 2015-11-18 HISTORY — DX: Other specified bacterial agents as the cause of diseases classified elsewhere: B96.89

## 2015-11-18 HISTORY — DX: Other specified bacterial agents as the cause of diseases classified elsewhere: N76.0

## 2015-11-18 HISTORY — DX: Anxiety disorder, unspecified: F41.9

## 2015-11-18 LAB — I-STAT BETA HCG BLOOD, ED (MC, WL, AP ONLY)

## 2015-11-18 LAB — URINALYSIS, ROUTINE W REFLEX MICROSCOPIC
BILIRUBIN URINE: NEGATIVE
Glucose, UA: NEGATIVE mg/dL
Hgb urine dipstick: NEGATIVE
KETONES UR: 40 mg/dL — AB
Leukocytes, UA: NEGATIVE
NITRITE: NEGATIVE
Protein, ur: NEGATIVE mg/dL
SPECIFIC GRAVITY, URINE: 1.022 (ref 1.005–1.030)
pH: 5.5 (ref 5.0–8.0)

## 2015-11-18 LAB — WET PREP, GENITAL
Sperm: NONE SEEN
Trich, Wet Prep: NONE SEEN
Yeast Wet Prep HPF POC: NONE SEEN

## 2015-11-18 MED ORDER — METRONIDAZOLE 500 MG PO TABS: 500.0000 mg | ORAL_TABLET | Freq: Two times a day (BID) | ORAL | Status: AC

## 2015-11-18 NOTE — ED Provider Notes (Signed)
CSN: [id number]     Arrival date & time 11/18/15  1201 History  By signing my name below, I, [facility], attest that this documentation has been prepared under the direction and in the presence of Arthor Captain, PA-C. Electronically Signed: Randell Patient, ED Scribe. 11/18/2015. 2:09 PM.    Chief Complaint  Patient presents with  . Foreign Body in Vagina    The history is provided by the patient. No language interpreter was used.   HPI Comments: Katherine West is a 25 y.o. female brought in from [facility] with a hx of anxiety and BV who presents to the Emergency Department complaining of a possible tampon stuck in her vagina that occurred four days ago. Pt states that she had been drinking for the past week and recalls inserting a tampon four days ago but does not remember removing it. She reports associated malodorous, vaginal discharge and abdominal cramping for the same time period. She reports that she has attempted and failed to locate the tampon in her vagina and came to the ED for further care. She notes that she had unprotected sex recently with a single, known female partner and is concerned that she may have an STD. Per pt, she was recently discharge from [facility] for SI and ETOH detox yesterday. Denies vaginal pain or any other symptoms currently.  Past Medical History  Diagnosis Date  . Anxiety   . BV (bacterial vaginosis)    No past surgical history on file. No family history on file. Social History  Substance Use Topics  . Smoking status: Never Smoker   . Smokeless tobacco: Never Used  . Alcohol Use: Yes     Comment: feels she drinks a lot   OB History    No data available     Review of Systems  Gastrointestinal: Positive for abdominal pain (cramping).  Genitourinary: Positive for vaginal discharge. Negative for vaginal pain.      Allergies  Review of patient's allergies indicates no known allergies.  Home Medications   Prior to Admission medications    Medication Sig Start Date End Date Taking? Authorizing Provider  gabapentin (NEURONTIN) 100 MG capsule Take 2 capsules (200 mg total) by mouth 2 (two) times daily. Patient not taking: Reported on 11/17/2015 11/11/15   Thermon Leyland, NP  hydrOXYzine (ATARAX/VISTARIL) 25 MG tablet Take 1 tablet (25 mg total) by mouth every 6 (six) hours as needed for anxiety (or CIWA score </= 10). 11/11/15   Thermon Leyland, NP  metroNIDAZOLE (FLAGYL) 500 MG tablet Take 1 tablet (500 mg total) by mouth 2 (two) times daily. One po bid x 7 days 11/18/15   Arthor Captain, PA-C  Multiple Vitamin (MULTIVITAMIN WITH MINERALS) TABS tablet Take 1 tablet by mouth daily. 11/11/15   Thermon Leyland, NP   BP 136/80 mmHg  Pulse 79  Temp(Src) 98.8 F (37.1 C) (Oral)  Resp 13  Ht 6' (1.829 m)  Wt 147 lb (66.679 kg)  BMI 19.93 kg/m2  LMP 11/16/2015 (Exact Date) Physical Exam  Constitutional: She is oriented to person, place, and time. She appears well-developed and well-nourished. No distress.  HENT:  Head: Normocephalic and atraumatic.  Eyes: Conjunctivae are normal.  Neck: Normal range of motion.  Cardiovascular: Normal rate.   Pulmonary/Chest: Effort normal. No respiratory distress.  Genitourinary: Cervix exhibits no motion tenderness. Right adnexum displays no fullness. Left adnexum displays no tenderness and no fullness. Vaginal discharge found.  Normal external female genitalia. No tampon or foreign body visualized  in the vagina. Copious vaginal discharge which is thin, brown, and blood-tinged. No cervical discharge. No CMT or adnexal pain or fullness.  Musculoskeletal: Normal range of motion.  Neurological: She is alert and oriented to person, place, and time.  Skin: Skin is warm and dry.  Psychiatric: She has a normal mood and affect. Her behavior is normal.  Nursing note and vitals reviewed.   ED Course  Procedures   COORDINATION OF CARE: 1:12 PM Will order labs. Discussed treatment plan with pt at bedside and pt  agreed to plan.  2:09 PM Reviewed results of labs. Will prescribe Flagyl. Will discharge pt.  Labs Review Labs Reviewed  WET PREP, GENITAL - Abnormal; Notable for the following:    Clue Cells Wet Prep HPF POC PRESENT (*)    WBC, Wet Prep HPF POC MANY (*)    All other components within normal limits  URINALYSIS, ROUTINE W REFLEX MICROSCOPIC (NOT AT [facility]) - Abnormal; Notable for the following:    Color, Urine AMBER (*)    APPearance CLOUDY (*)    Ketones, ur 40 (*)    All other components within normal limits  RPR  HIV ANTIBODY (ROUTINE TESTING)  I-STAT BETA HCG BLOOD, ED ([facility], [facility], [facility] ONLY)  GC/CHLAMYDIA PROBE AMP ([place]) NOT AT [facility]     I have personally reviewed and evaluated these lab results as part of my medical decision-making.   MDM   Final diagnoses:  BV (bacterial vaginosis)    Patient with BV. No retained FB. Patient will be discharged with flagyl. Warned expressly not to drink ETOH with this medication.  Discussed return precautions.   I personally performed the services described in this documentation, which was scribed in my presence. The recorded information has been reviewed and is accurate.       Arthor Captainbigail Shuntia [place], PA-C 11/18/15 1414  Vanetta MuldersScott Zackowski, MD 11/22/15 1346

## 2015-11-18 NOTE — Discharge Summary (Signed)
Physician Discharge Summary Note  Patient:  Katherine West is an 25 y.o., female MRN:  [id number] DOB:  02-15-1991 Patient phone:  [phone] (home)  Patient address:   [address] [place] [address],  Total Time spent with patient: 20 minutes  Date of Admission:  11/17/2015 Date of Discharge: 11/18/2015  Reason for Admission: PER tele assessment- Katherine West is an 25 y.o. single female who presents unaccompanied and highly intoxicated to [facility] reporting suicidal ideation. Pt cannot remember how she came to the ED but medical record indicates that Pt was intoxicated, fell twice today and EMS was called by her friends. Pt is still intoxicated and states she enjoys drinking but is also concerned that she is going to kil herself. She states repeatedly during assessment that she wants to end her life and also states she wishes she had a rope so she could hang herself. Pt says one moment she is fearful because she is drinking so heavily and the next moment is asking for alcohol. She states her birthday is in three days and she is looking forward to drinking but also wonders if she will die from alcohol poisoning. Pt reports using approximately 2 mg of Xanax on a daily basis, which she says is due to experiencing severe anxiety. Pt says she knows she shouldn't drink alcohol and takes Xanax "but I don't care." Pt denies homicidal ideation but says she can be aggressive when intoxicated. She said during assessment she wanted to pull out her IV and leave. She denies auditory of visual hallucinations  Principal Problem: Alcohol-induced mood disorder [facility]) Discharge Diagnoses: Patient Active Problem List   Diagnosis Date Noted  . Pulmonary embolism (HCC) [I26.99] 11/17/2015  . Alcohol-induced mood disorder (HCC) [F10.94] 11/17/2015  . Alcohol use disorder, severe, dependence (HCC) [F10.20] 11/10/2015  . 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 Substance induced mood  disorder (HCC) [F19.94] 11/10/2015  . Alcohol abuse [F10.10] 11/10/2015    Past Psychiatric History: See Above  Past Medical History: History reviewed. No pertinent past medical history. History reviewed. No pertinent past surgical history. Family History: History reviewed. No pertinent family history. Family Psychiatric  History: *unknown Social History:  History  Alcohol Use  . Yes     History  Drug Use No    Social History   Social History  . Marital Status: Single    Spouse Name: N/A  . Number of Children: N/A  . Years of Education: N/A   Social History Main Topics  . Smoking status: Never Smoker   . Smokeless tobacco: Never Used  . Alcohol Use: Yes  . Drug Use: No  . Sexual Activity: Not Asked   Other Topics Concern  . None   Social History Narrative    Hospital Course:  Katherine West was admitted for Alcohol-induced mood disorder (HCC) and crisis management.  Pt was treated discharged with the medications listed below under Medication List.  Medical problems were identified and treated as needed.  Home medications were restarted as appropriate.  Improvement was monitored by observation and [name] 's daily report of symptom reduction.  Emotional and mental status was monitored by daily self-inventory reports completed by [name] and clinical staff.         Katherine West was evaluated by the treatment team for stability and plans for continued recovery upon discharge. Katherine West 's motivation was an integral factor for scheduling further treatment. Employment, transportation, bed availability, health status, family support, and  any pending legal issues were also considered during hospital stay. Pt was offered further treatment options upon discharge including but not limited to Residential, Intensive Outpatient, and Outpatient treatment.  [name] will follow up with the services as listed below under Follow Up Information.      Upon completion of this admission the patient was both mentally and medically stable for discharge denying suicidal/homicidal ideation, auditory/visual/tactile hallucinations, delusional thoughts and paranoia.  Patient to follow-up at the [facility] Emergency Dept. Patient report a tampon stuck for the past 2 to 3 days. Reports she can't recall how long the tampon as been stuck for. Patient reports she is from [place], [place] and was up for the" weekend partying." Patient reports a recent discharge from [facility] inpatient [facility]  [facility]. states she is not interested  in changing her behaviors at this time. Patient reports "my birthday is 2 days and may consider taking my medications after the 10th.". prdx gabapentin and vistaril. Patient denies suicidal or homicidal ideations during this assessment. -resources  provided for [facility].  Katherine West responded well to treatment with Detox protocol and  without adverse effects. Pt demonstrated improvement without reported or observed adverse effects to the point of stability appropriate for outpatient management. Pertinent labs include: CMP for which outpatient follow-up is necessary for lab recheck as mentioned below. Reviewed CBC, CMP, BAL+391 on admission, and UDS; all unremarkable aside from noted exceptions.   Physical Findings: AIMS: Facial and Oral Movements Muscles of Facial Expression: None, normal Lips and Perioral Area: None, normal Jaw: None, normal Tongue: None, normal,Extremity Movements Upper (arms, wrists, hands, fingers): None, normal Lower (legs, knees, ankles, toes): None, normal, Trunk Movements Neck, shoulders, hips: None, normal, Overall Severity Severity of abnormal movements (highest score from questions above): None, normal Incapacitation due to abnormal movements: None, normal Patient's awareness of abnormal movements (rate only patient's report): No Awareness, Dental Status Current problems with teeth and/or  dentures?: No Does patient usually wear dentures?: No  CIWA:  CIWA-Ar Total: 3 COWS:  COWS Total Score: 9  Musculoskeletal: Strength & Muscle Tone: within normal limits Gait & Station: normal Patient leans: N/A  Psychiatric Specialty Exam: Physical Exam  Nursing note and vitals reviewed. Constitutional: She is oriented to person, place, and time. She appears well-developed.  Neurological: She is alert and oriented to person, place, and time.  Psychiatric: She has a normal mood and affect. Her behavior is normal.    Review of Systems  Psychiatric/Behavioral: Negative for suicidal ideas and hallucinations. Depression: stable. Nervous/anxious: stable. Insomnia: stable.   All other systems reviewed and are negative.   Blood pressure 102/58, pulse 53, temperature 98.4 F (36.9 C), temperature source Oral, resp. rate 17, height 6' (1.829 m), weight 67.132 kg (148 lb), last menstrual period 11/16/2015, SpO2 100 %.Body mass index is 20.07 kg/(m^2).  General Appearance: Casual  Eye Contact:  Good  Speech:  Clear and Coherent  Volume:  Normal  Mood:  Euthymic  Affect:  Congruent  Thought Process:  Coherent  Orientation:  Full (Time, Place, and Person)  Thought Content:  Hallucinations: None  Suicidal Thoughts:  No  Homicidal Thoughts:  No  Memory:  Immediate;   Good Recent;   Good Remote;   Good  Judgement:  Intact  Insight:  Lacking  Psychomotor Activity:  Normal  Concentration:  Concentration: Fair  Recall:  [employer] of Knowledge:  Fair  Language:  Fair  Akathisia:  No  Handed:  Right  AIMS (if  indicated):     Assets:  Financial Resources/Insurance Social Support  ADL's:  Intact  Cognition:  WNL  Sleep:        Have you used any form of tobacco in the last 30 days? (Cigarettes, Smokeless Tobacco, Cigars, and/or Pipes): No  Has this patient used any form of tobacco in the last 30 days? (Cigarettes, Smokeless Tobacco, Cigars, and/or Pipes) Yes, No  Blood Alcohol  level:  Lab Results  Component Value Date   ETH 212* 11/17/2015   ETH 281* 11/17/2015    Metabolic Disorder Labs:  No results found for: HGBA1C, MPG No results found for: PROLACTIN No results found for: CHOL, TRIG, HDL, CHOLHDL, VLDL, LDLCALC  See Psychiatric Specialty Exam and Suicide Risk Assessment completed by Attending Physician prior to discharge.  Discharge destination:  Home  Is patient on multiple antipsychotic therapies at discharge:  No   Has Patient had three or more failed trials of antipsychotic monotherapy by history:  No  Recommended Plan for Multiple Antipsychotic Therapies: NA  Discharge Instructions    Diet - low sodium heart healthy    Complete by:  As directed      Discharge instructions    Complete by:  As directed   Take all medications as prescribed. Keep all follow-up appointments as scheduled.  Do not consume alcohol or use illegal drugs while on prescription medications. Report any adverse effects from your medications to your primary care provider promptly.  In the event of recurrent symptoms or worsening symptoms, call 911, a crisis hotline, or go to the nearest emergency department for evaluation.     Increase activity slowly    Complete by:  As directed             Medication List    STOP taking these medications        CALCIUM PO     ibuprofen 800 MG tablet  Commonly known as:  ADVIL,MOTRIN      TAKE these medications      Indication   gabapentin 100 MG capsule  Commonly known as:  NEURONTIN  Take 2 capsules (200 mg total) by mouth 2 (two) times daily.   Indication:  Aggressive Behavior, Alcohol Withdrawal Syndrome, Social Anxiety Disorder     hydrOXYzine 25 MG tablet  Commonly known as:  ATARAX/VISTARIL  Take 1 tablet (25 mg total) by mouth every 6 (six) hours as needed for anxiety (or CIWA score </= 10).   Indication:  Anxiety Neurosis     multivitamin with minerals Tabs tablet  Take 1 tablet by mouth daily.   Indication:   Vitamin Supplementation         Follow-up recommendations:  Activity:  as tolerated Diet:  heart healthy as recom by priamry care provider Other:  [facility] for tampon removal.  Comments:  Take all medications as prescribed. Keep all follow-up appointments as scheduled.  Do not consume alcohol or use illegal drugs while on prescription medications. Report any adverse effects from your medications to your primary care provider promptly.  In the event of recurrent symptoms or worsening symptoms, call 911, a crisis hotline, or go to the nearest emergency department for evaluation.   Signed: Oneta Rackanika N Rosamary Boudreau, NP 11/18/2015, 10:06 AM

## 2015-11-18 NOTE — Discharge Instructions (Signed)

## 2015-11-18 NOTE — Progress Notes (Signed)
Patient was resting in bed this am, she was calm and cooperative. She stated that she would be going back to [place] today. She denied SI, HI, and AVH. She stated that she did have any depression, anxiety, or hopelessness.   Patient remained safe on floor with constant monitoring with exception of bathroom times. She was offered education, encouragement, and support. She was administered medications.   Patient is receptive and compliant, will continue to monitor.

## 2015-11-18 NOTE — BHH Counselor (Signed)
Pt will be discharged after 11:00 am and was instructed to follow -up with [facility] [place], [place] on Monday, November 20, 2015. Pt was provided with the address and contact number for this facility. Post discharge pt [facility].   Ardelle ParkLatoya McNeil, MA OBS Counselor

## 2015-11-18 NOTE — Progress Notes (Signed)
D Pt. Denies SI and HI, no complaints of pain or discomfort noted at present time. Pt. Did experience some nausea this pm  A Writer offered support and encouragement, discussed medications to be administered.  R Pt. Is calm and cooperative. Pt. Has been resting most of this pm and remains safe on the unit.

## 2015-11-18 NOTE — Progress Notes (Signed)
[facility] Patient was given belongings, AVS, and discharge information.

## 2015-11-18 NOTE — ED Notes (Signed)
[facility] and sent here to see if she's still got a tampon in.  She states she's been drunk over the past week because it's summertime and her birthday is coming up and can't remember if she took it out or not. States she has cramping but feels her period should be over.

## 2015-11-19 LAB — HIV ANTIBODY (ROUTINE TESTING W REFLEX): HIV SCREEN 4TH GENERATION: NONREACTIVE

## 2015-11-19 LAB — RPR: RPR: NONREACTIVE

## 2015-11-20 LAB — GC/CHLAMYDIA PROBE AMP (~~LOC~~) NOT AT ARMC
CHLAMYDIA, DNA PROBE: NEGATIVE
NEISSERIA GONORRHEA: NEGATIVE
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
